# Patient Record
Sex: Female | Born: 1960 | Race: Black or African American | Hispanic: No | Marital: Married | Smoking: Current every day smoker
Health system: Southern US, Community
[De-identification: ages and names within clinical notes are randomized; demographics above are authoritative.]

## PROBLEM LIST (undated history)

## (undated) DIAGNOSIS — I10 Essential (primary) hypertension: Secondary | ICD-10-CM

## (undated) DIAGNOSIS — Z973 Presence of spectacles and contact lenses: Secondary | ICD-10-CM

## (undated) DIAGNOSIS — K219 Gastro-esophageal reflux disease without esophagitis: Secondary | ICD-10-CM

## (undated) HISTORY — PX: TUBAL LIGATION: SHX77

## (undated) HISTORY — DX: Presence of spectacles and contact lenses: Z97.3

## (undated) HISTORY — PX: COLONOSCOPY: SHX174

## (undated) HISTORY — DX: Gastro-esophageal reflux disease without esophagitis: K21.9

## (undated) HISTORY — DX: Essential (primary) hypertension: I10

## (undated) HISTORY — PX: ESOPHAGOGASTRODUODENOSCOPY: SHX1529

---

## 2001-03-01 ENCOUNTER — Emergency Department (HOSPITAL_COMMUNITY): Admission: EM | Admit: 2001-03-01 | Discharge: 2001-03-01 | Payer: Self-pay | Admitting: Emergency Medicine

## 2001-06-09 ENCOUNTER — Emergency Department (HOSPITAL_COMMUNITY): Admission: EM | Admit: 2001-06-09 | Discharge: 2001-06-09 | Payer: Self-pay | Admitting: Emergency Medicine

## 2001-06-09 ENCOUNTER — Encounter: Payer: Self-pay | Admitting: Emergency Medicine

## 2002-08-06 ENCOUNTER — Encounter (INDEPENDENT_AMBULATORY_CARE_PROVIDER_SITE_OTHER): Payer: Self-pay | Admitting: *Deleted

## 2002-08-14 ENCOUNTER — Encounter: Admission: RE | Admit: 2002-08-14 | Discharge: 2002-08-14 | Payer: Self-pay | Admitting: Family Medicine

## 2002-08-22 ENCOUNTER — Encounter: Payer: Self-pay | Admitting: Family Medicine

## 2002-08-22 ENCOUNTER — Encounter: Admission: RE | Admit: 2002-08-22 | Discharge: 2002-08-22 | Payer: Self-pay | Admitting: Family Medicine

## 2003-07-21 ENCOUNTER — Ambulatory Visit (HOSPITAL_COMMUNITY): Admission: RE | Admit: 2003-07-21 | Discharge: 2003-07-21 | Payer: Self-pay | Admitting: Family Medicine

## 2003-07-21 ENCOUNTER — Encounter: Admission: RE | Admit: 2003-07-21 | Discharge: 2003-07-21 | Payer: Self-pay | Admitting: Family Medicine

## 2005-05-26 ENCOUNTER — Emergency Department (HOSPITAL_COMMUNITY): Admission: EM | Admit: 2005-05-26 | Discharge: 2005-05-27 | Payer: Self-pay | Admitting: *Deleted

## 2006-08-31 ENCOUNTER — Encounter (INDEPENDENT_AMBULATORY_CARE_PROVIDER_SITE_OTHER): Payer: Self-pay | Admitting: *Deleted

## 2007-02-03 ENCOUNTER — Emergency Department (HOSPITAL_COMMUNITY): Admission: EM | Admit: 2007-02-03 | Discharge: 2007-02-04 | Payer: Self-pay | Admitting: Emergency Medicine

## 2008-02-19 ENCOUNTER — Emergency Department (HOSPITAL_COMMUNITY): Admission: EM | Admit: 2008-02-19 | Discharge: 2008-02-20 | Payer: Self-pay | Admitting: Emergency Medicine

## 2008-02-21 ENCOUNTER — Emergency Department (HOSPITAL_COMMUNITY): Admission: EM | Admit: 2008-02-21 | Discharge: 2008-02-21 | Payer: Self-pay | Admitting: Emergency Medicine

## 2008-02-24 ENCOUNTER — Ambulatory Visit: Payer: Self-pay | Admitting: Family Medicine

## 2009-05-02 ENCOUNTER — Emergency Department (HOSPITAL_COMMUNITY): Admission: EM | Admit: 2009-05-02 | Discharge: 2009-05-02 | Payer: Self-pay | Admitting: Emergency Medicine

## 2010-10-06 LAB — URINALYSIS, ROUTINE W REFLEX MICROSCOPIC
Bilirubin Urine: NEGATIVE
Glucose, UA: NEGATIVE mg/dL
Hgb urine dipstick: NEGATIVE
Ketones, ur: NEGATIVE mg/dL
Protein, ur: NEGATIVE mg/dL
pH: 5.5 (ref 5.0–8.0)

## 2010-10-06 LAB — COMPREHENSIVE METABOLIC PANEL
Alkaline Phosphatase: 60 U/L (ref 39–117)
BUN: 9 mg/dL (ref 6–23)
Creatinine, Ser: 0.87 mg/dL (ref 0.4–1.2)
Glucose, Bld: 107 mg/dL — ABNORMAL HIGH (ref 70–99)
Potassium: 3.3 mEq/L — ABNORMAL LOW (ref 3.5–5.1)
Total Bilirubin: 0.5 mg/dL (ref 0.3–1.2)
Total Protein: 7.5 g/dL (ref 6.0–8.3)

## 2010-10-06 LAB — DIFFERENTIAL
Basophils Absolute: 0.2 10*3/uL — ABNORMAL HIGH (ref 0.0–0.1)
Basophils Relative: 1 % (ref 0–1)
Lymphocytes Relative: 35 % (ref 12–46)
Monocytes Relative: 5 % (ref 3–12)
Neutro Abs: 7.5 10*3/uL (ref 1.7–7.7)
Neutrophils Relative %: 58 % (ref 43–77)

## 2010-10-06 LAB — POCT CARDIAC MARKERS
CKMB, poc: 3.4 ng/mL (ref 1.0–8.0)
Myoglobin, poc: 167 ng/mL (ref 12–200)

## 2010-10-06 LAB — CBC
HCT: 41.9 % (ref 36.0–46.0)
Hemoglobin: 13.9 g/dL (ref 12.0–15.0)
MCHC: 33.1 g/dL (ref 30.0–36.0)
MCV: 83.6 fL (ref 78.0–100.0)
RDW: 15.5 % (ref 11.5–15.5)

## 2011-01-12 IMAGING — CT CT HEAD W/O CM
1 series · 16 of 30 positions shown, 20 images · non-contrast
Comparison: 02/03/2007

CLINICAL DATA: Right-sided headache.

CT HEAD WITHOUT CONTRAST
TECHNIQUE: Contiguous axial images were obtained from the base of
the skull through the vertex without contrast.

[Series 2: headseq 4.8 h45s · axial · 0.43mm/px · z∈[-155,-26]mm · 16 of 30 slices shown, 20 images]
[im 2/30  brain]
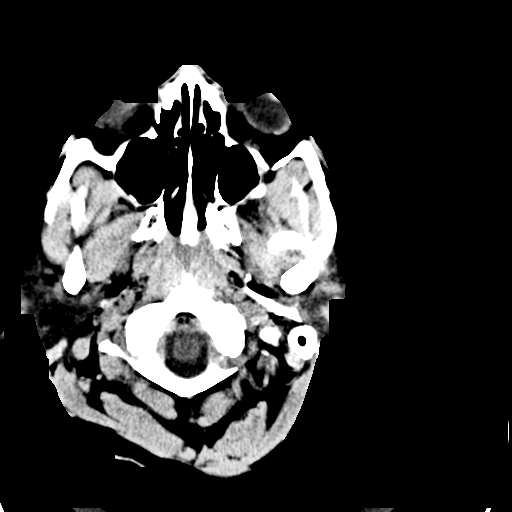
[im 2/30  bone]
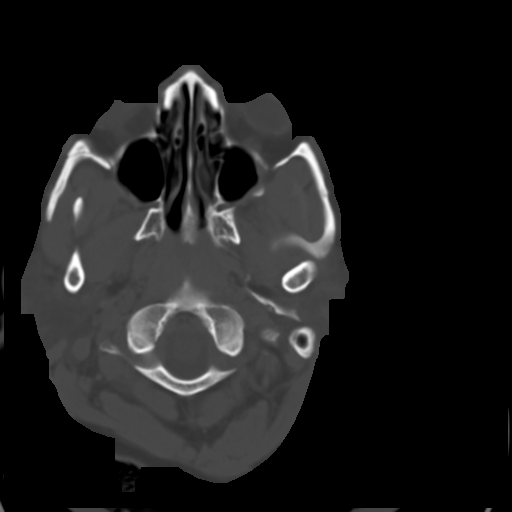
[im 4/30  brain]
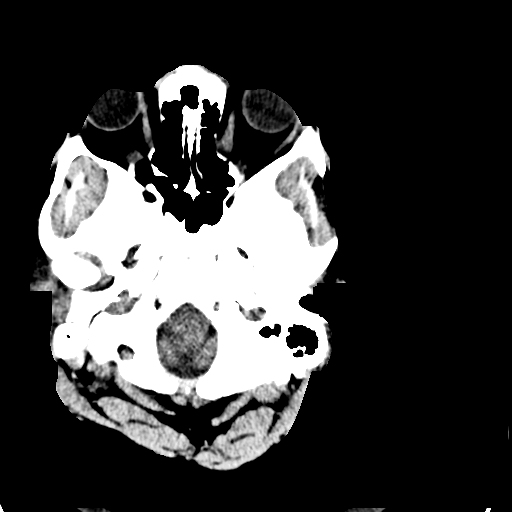
[im 6/30  brain]
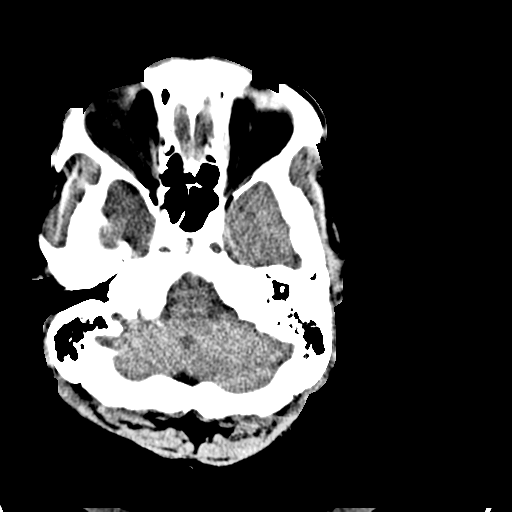
[im 8/30  brain]
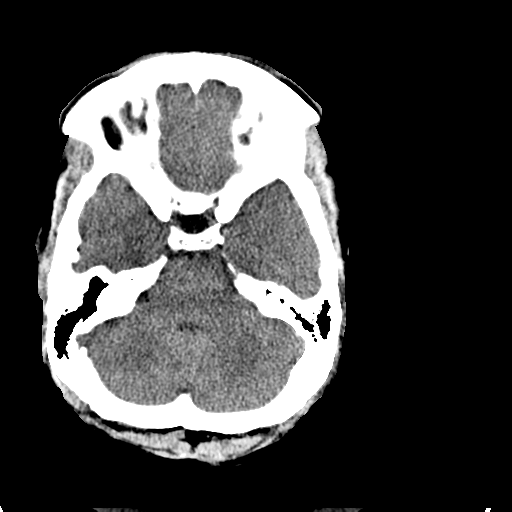
[im 9/30  brain]
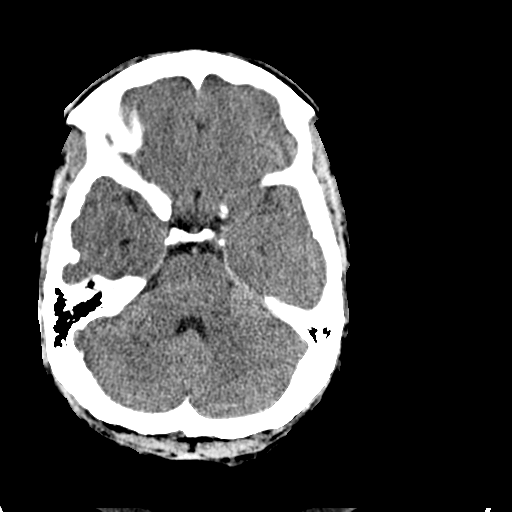
[im 9/30  bone]
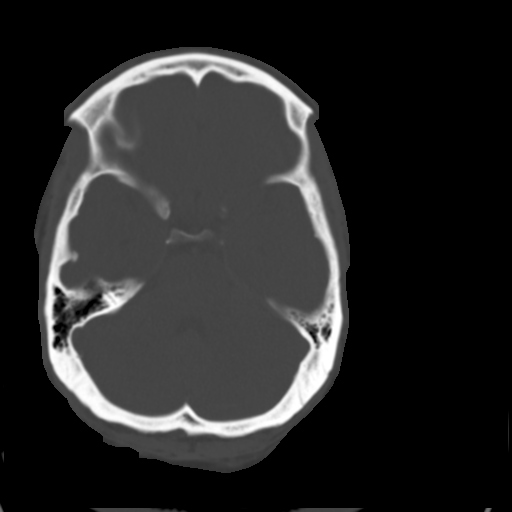
[im 11/30  brain]
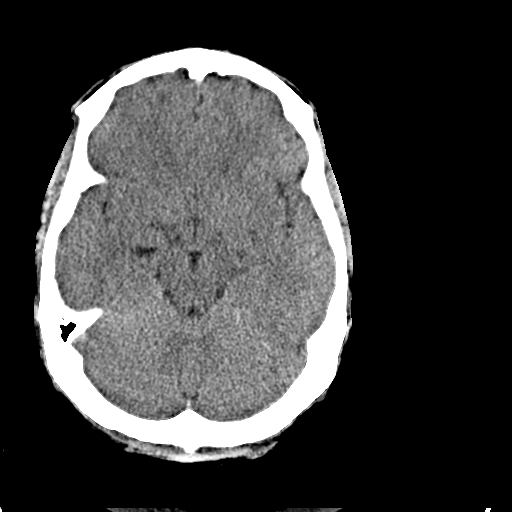
[im 13/30  brain]
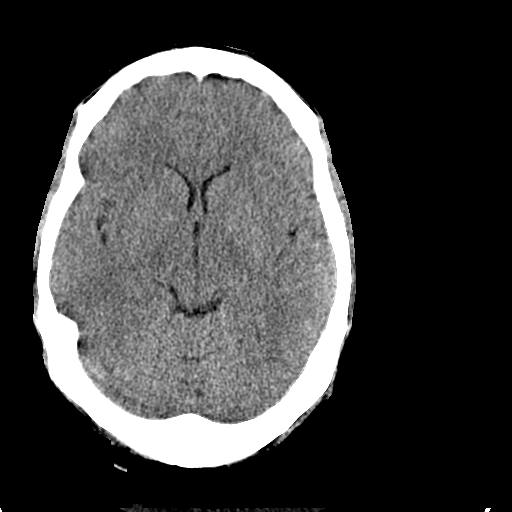
[im 15/30  brain]
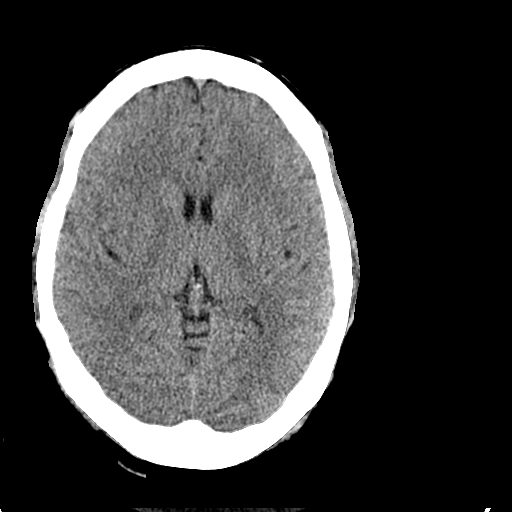
[im 16/30  brain]
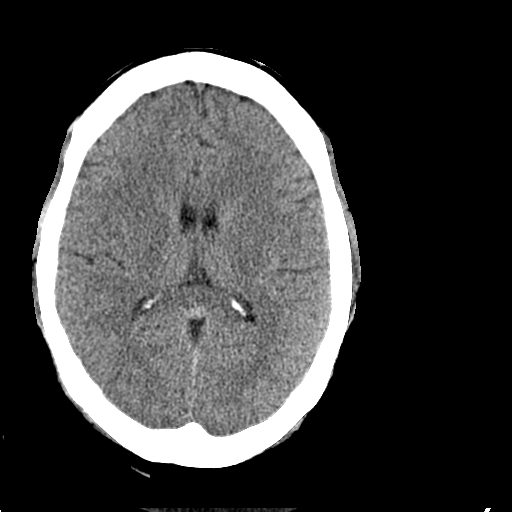
[im 16/30  bone]
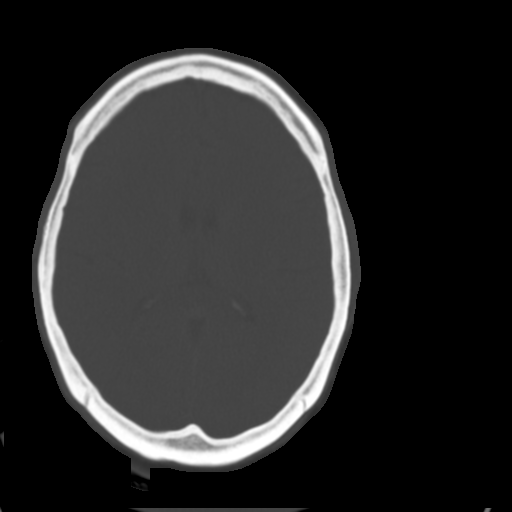
[im 18/30  brain]
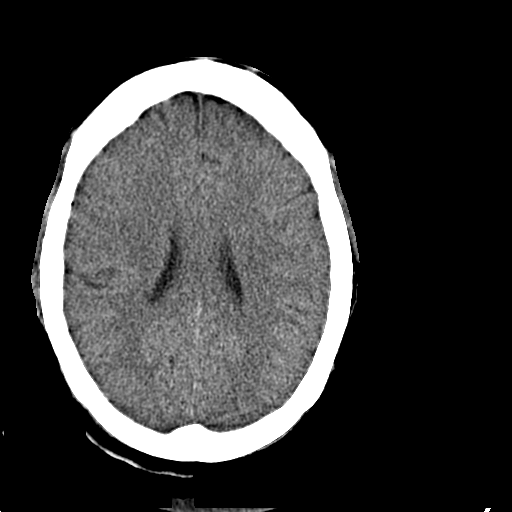
[im 20/30  brain]
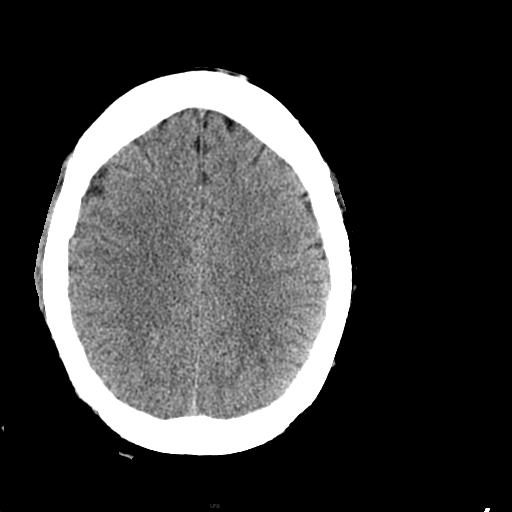
[im 22/30  brain]
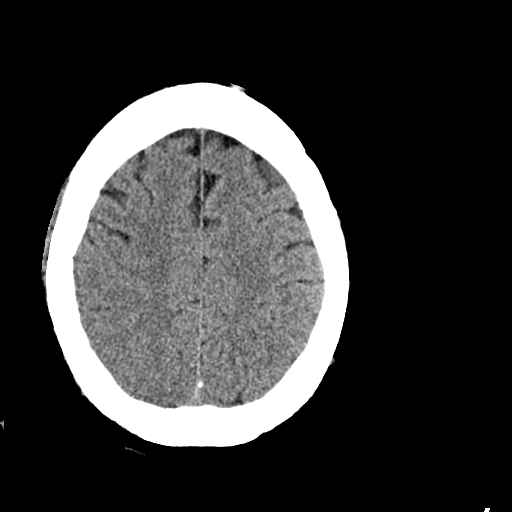
[im 23/30  brain]
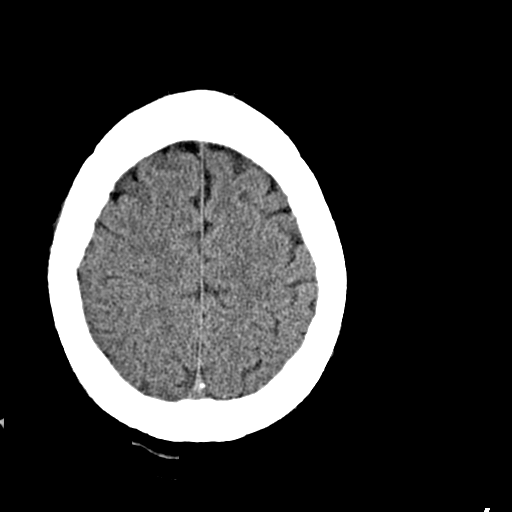
[im 23/30  bone]
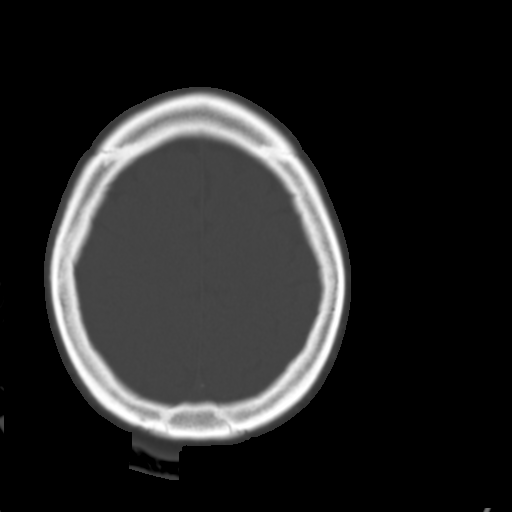
[im 25/30  brain]
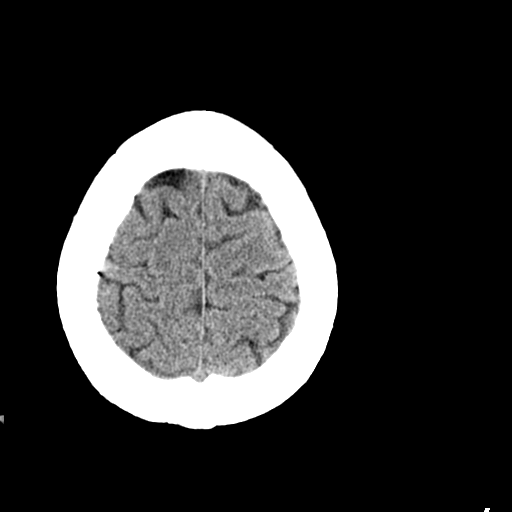
[im 27/30  brain]
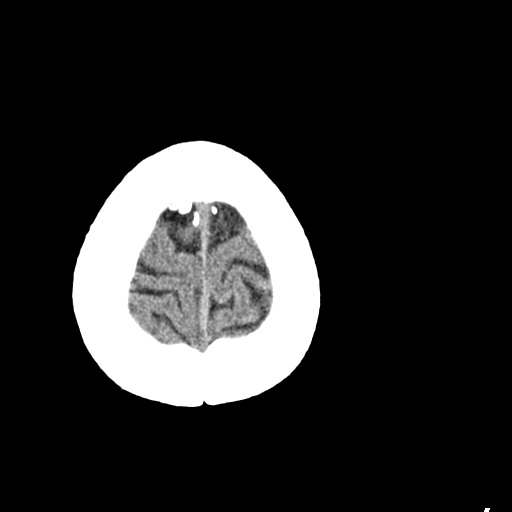
[im 29/30  brain]
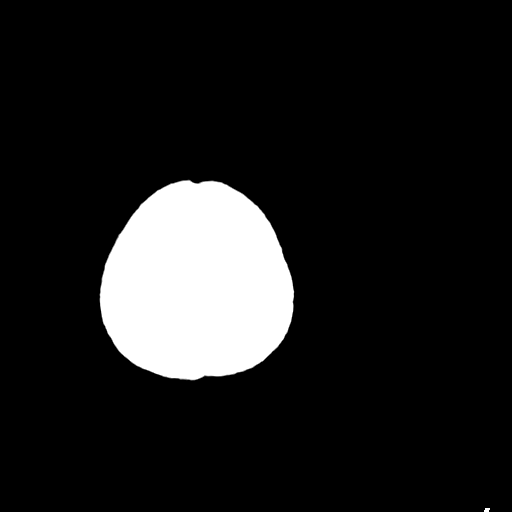

[16 of 30 positions shown; findings below may reference images not displayed]

FINDINGS: There is no acute infarction, intracranial hemorrhage, or
intracranial mass lesion.  Brain parenchyma appears normal.

There is no acute bony abnormality.  There is an old fracture of
the medial wall of the left orbit.
IMPRESSION: No acute intracranial abnormality.

## 2011-01-29 ENCOUNTER — Inpatient Hospital Stay (INDEPENDENT_AMBULATORY_CARE_PROVIDER_SITE_OTHER)
Admission: RE | Admit: 2011-01-29 | Discharge: 2011-01-29 | Disposition: A | Discharge status: Home / Self Care | Payer: Self-pay | Source: Ambulatory Visit | Attending: Family Medicine | Admitting: Family Medicine

## 2011-01-29 DIAGNOSIS — K219 Gastro-esophageal reflux disease without esophagitis: Secondary | ICD-10-CM

## 2011-10-22 ENCOUNTER — Encounter (HOSPITAL_COMMUNITY): Payer: Self-pay | Admitting: Emergency Medicine

## 2011-10-22 ENCOUNTER — Emergency Department (INDEPENDENT_AMBULATORY_CARE_PROVIDER_SITE_OTHER)
Admission: EM | Admit: 2011-10-22 | Discharge: 2011-10-22 | Disposition: A | Discharge status: Home / Self Care | Payer: BC Managed Care – PPO | Source: Home / Self Care | Attending: Family Medicine | Admitting: Family Medicine

## 2011-10-22 DIAGNOSIS — G5601 Carpal tunnel syndrome, right upper limb: Secondary | ICD-10-CM

## 2011-10-22 DIAGNOSIS — G5600 Carpal tunnel syndrome, unspecified upper limb: Secondary | ICD-10-CM

## 2011-10-22 MED ORDER — IBUPROFEN 800 MG PO TABS: 800.0000 mg | ORAL_TABLET | Freq: Three times a day (TID) | ORAL | Status: AC

## 2011-10-22 NOTE — ED Notes (Signed)
Right arm/hand pain and numbness onset 3 weeks ago.  Initially felt like hand going to sleep, now more frequent and pain is more significant.  Patient is right handed.  No known injury.

## 2011-10-22 NOTE — ED Provider Notes (Signed)
History     CSN: [id number]  Arrival date & time 10/22/11  0946   First MD Initiated Contact with Patient 10/22/11 [phone]      Chief Complaint  Patient presents with  . Arm Pain    (Consider location/radiation/quality/duration/timing/severity/associated sxs/prior treatment) Patient is a 51 y.o. female presenting with wrist pain. The history is provided by the patient.  Wrist Pain This is a new problem. The current episode started more than 1 week ago (3 weeks of pain which today has spread to forearm). The problem occurs constantly. The problem has been gradually worsening. Pertinent negatives include no chest pain, no abdominal pain, no headaches and no shortness of breath. Associated symptoms comments: Tingling worse at night and in am, no weakness, no other neuro sx to arm, neck or left side sx.Marland Kitchen    History reviewed. No pertinent past medical history.  History reviewed. No pertinent past surgical history.  No family history on file.  History  Substance Use Topics  . Smoking status: Current Everyday Smoker  . Smokeless tobacco: Not on file  . Alcohol Use: Yes    OB History    Grav Para Term Preterm Abortions TAB SAB Ect Mult Living                  Review of Systems  Constitutional: Negative.   HENT: Negative.   Respiratory: Negative for shortness of breath.   Cardiovascular: Negative for chest pain.  Gastrointestinal: Negative for abdominal pain.  Musculoskeletal: Negative for back pain and joint swelling.  Neurological: Positive for numbness. Negative for dizziness, syncope, speech difficulty, weakness and headaches.    Allergies  Review of patient's allergies indicates no known allergies.  Home Medications   Current Outpatient Rx  Name Route Sig Dispense Refill  . ACETAMINOPHEN 325 MG PO TABS Oral Take 650 mg by mouth every 6 (six) hours as needed.    Marland Kitchen NAPROXEN SODIUM 220 MG PO TABS Oral Take 220 mg by mouth 2 (two) times daily with a meal.    . IBUPROFEN  800 MG PO TABS Oral Take 1 tablet (800 mg total) by mouth 3 (three) times daily. 30 tablet 1    BP 142/83  Pulse 83  Temp(Src) 98.6 F (37 C) (Oral)  Resp 17  SpO2 100%  Physical Exam  Nursing note and vitals reviewed. Constitutional: She is oriented to person, place, and time. She appears well-developed and well-nourished.  HENT:  Head: Normocephalic.  Right Ear: External ear normal.  Left Ear: External ear normal.  Mouth/Throat: Oropharynx is clear and moist.  Eyes: Conjunctivae are normal. Pupils are equal, round, and reactive to light.  Neck: Normal range of motion. Neck supple. No thyromegaly present.  Cardiovascular: Normal rate, normal heart sounds and intact distal pulses.   Pulmonary/Chest: Breath sounds normal.  Musculoskeletal: Normal range of motion.       Arms: Lymphadenopathy:    She has no cervical adenopathy.  Neurological: She is alert and oriented to person, place, and time. She has normal reflexes.  Skin: Skin is warm and dry.  Psychiatric: She has a normal mood and affect.    ED Course  Procedures (including critical care time)  Labs Reviewed - No data to display No results found.   1. Carpal tunnel syndrome of right wrist       MDM         Linna Hoff, MD 10/22/11 1159

## 2011-10-22 NOTE — Discharge Instructions (Signed)
Wear splint for comfort but esp wear at night, see orthopedist if further problems.

## 2012-05-21 ENCOUNTER — Other Ambulatory Visit: Payer: Self-pay | Admitting: Medical

## 2012-05-21 ENCOUNTER — Encounter: Payer: Self-pay | Admitting: Medical

## 2012-05-21 ENCOUNTER — Ambulatory Visit (INDEPENDENT_AMBULATORY_CARE_PROVIDER_SITE_OTHER): Payer: BC Managed Care – PPO | Admitting: Medical

## 2012-05-21 VITALS — BP 140/100 | HR 115 | Temp 99.3°F | Resp 16 | Wt 172.0 lb

## 2012-05-21 DIAGNOSIS — K219 Gastro-esophageal reflux disease without esophagitis: Secondary | ICD-10-CM

## 2012-05-21 DIAGNOSIS — R509 Fever, unspecified: Secondary | ICD-10-CM

## 2012-05-21 DIAGNOSIS — R112 Nausea with vomiting, unspecified: Secondary | ICD-10-CM

## 2012-05-21 DIAGNOSIS — E860 Dehydration: Secondary | ICD-10-CM

## 2012-05-21 DIAGNOSIS — R10819 Abdominal tenderness, unspecified site: Secondary | ICD-10-CM

## 2012-05-21 LAB — CBC WITH DIFFERENTIAL/PLATELET
Basophils Absolute: 0 10*3/uL (ref 0.0–0.1)
Lymphocytes Relative: 29 % (ref 12–46)
Neutro Abs: 9.8 10*3/uL — ABNORMAL HIGH (ref 1.7–7.7)
Neutrophils Relative %: 63 % (ref 43–77)
Platelets: 397 10*3/uL (ref 150–400)
RDW: 14.4 % (ref 11.5–15.5)
WBC: 15.6 10*3/uL — ABNORMAL HIGH (ref 4.0–10.5)

## 2012-05-21 LAB — HEPATIC FUNCTION PANEL
AST: 14 U/L (ref 0–37)
Albumin: 4.9 g/dL (ref 3.5–5.2)
Total Bilirubin: 0.7 mg/dL (ref 0.3–1.2)

## 2012-05-21 LAB — LIPASE: Lipase: 16 U/L (ref 0–75)

## 2012-05-21 LAB — BASIC METABOLIC PANEL
BUN: 31 mg/dL — ABNORMAL HIGH (ref 6–23)
Calcium: 10.6 mg/dL — ABNORMAL HIGH (ref 8.4–10.5)
Glucose, Bld: 115 mg/dL — ABNORMAL HIGH (ref 70–99)

## 2012-05-21 MED ORDER — PROMETHAZINE HCL 25 MG/ML IJ SOLN
25.0000 mg | Freq: Once | INTRAMUSCULAR | Status: AC
  Administered 2012-05-21: 25 mg via INTRAMUSCULAR

## 2012-05-21 MED ORDER — PROMETHAZINE HCL 25 MG RE SUPP: 25.0000 mg | Freq: Four times a day (QID) | RECTAL | Status: DC | PRN

## 2012-05-21 MED ORDER — OMEPRAZOLE 20 MG PO TBEC: 1.0000 | DELAYED_RELEASE_TABLET | Freq: Two times a day (BID) | ORAL | Status: DC

## 2012-05-21 NOTE — Progress Notes (Signed)
Subjective: Here as a new patient today.  Was seeing urgent care prior for acute issues.  She reports 4 days ago awoke with nausea, vomiting, some projectile.  Since Friday been having multiple episodes of NV daily.   15 episodes of vomiting at least Sunday, 10 x yesterday, so far 4 times today.  She notes generalized weakness, dizziness, fatigue, fever up to 101.   Denies loose stool or diarrhea, no constipation, last BM was normal few days ago, but hasn't eaten anything the last few days to have a BM.  She works as a Financial risk analyst.  No sick contacts in home or at work.  Denies vomiting blood.  No urinary symptoms, no reports of undercooked meat or potentially contaminated food. No recent travel, no camping, no consumption of stream/lake water.  No new animal exposure.   She notes similar but milder episode last August related to bad reflux.  She thinks this is coming from reflux now.  She notes hx/o bad GERD, has tried various OTC medication, was on the purple pill once, but no prior EGD or colonoscopy.  She has had a lot of belching recently.  She also recently cut back on tobacco.  Of note she reports insurance physical with labs 4 mo ago that was "normal."  However, she was 202 lb and now has lost over 20 lb unintentionally.  Past Medical History  Diagnosis Date  . Hypertension   . Wears glasses   . GERD (gastroesophageal reflux disease)     Review of Systems Constitutional: +fever, +chills, +sweats, +unexpected weight change,+fatigue ENT: -runny nose, -ear pain, -sore throat Cardiology:  -chest pain, -palpitations, -edema Respiratory: -cough, -shortness of breath, -wheezing Gastroenterology: -abdominal pain, +nausea, +vomiting, -diarrhea, -constipation  Musculoskeletal: -arthralgias, +myalgias, -joint swelling, -back pain Ophthalmology: -vision changes Urology: -dysuria, -difficulty urinating, -hematuria, -urinary frequency, -urgency Neurology: -headache, +weakness, -tingling, -numbness        Objective:   Physical Exam  Filed Vitals:   05/21/12 1151  BP: 140/100  Pulse: 115  Temp:   Resp:     General appearance: alert, no distress, WD/WN, ill appearing Skin: no obvious decreased turgor HEENT: normocephalic, sclerae anicteric, TMs pearly, nares patent, no discharge or erythema, pharynx normal Oral cavity: dry mucous membranes, no lesions Neck: supple, no lymphadenopathy, no thyromegaly, no masses Heart: RRR, normal S1, S2, no murmurs Lungs: CTA bilaterally, no wheezes, rhonchi, or rales Abdomen: +bs, soft, generalized tenderness, no rebound, non distended, no masses, no hepatomegaly, no splenomegaly Pulses: 2+ symmetric, upper and lower extremities, normal cap refill   Assessment and Plan :    Encounter Diagnoses  Name Primary?  . Nausea & vomiting Yes  . Dehydration   . Fever   . GERD (gastroesophageal reflux disease)   . Abdominal tenderness    After reviewing symptoms, +orthostatic vitals, and reviewing differential, gave her options.  Also discussed eval and plan with Dr. Susann Givens, supervising physician.  Advised ED eval vs outpatient urgent eval.  She wants to avoid ED at this point.  We got stat labs today, gave IM Phenergan 25mg  in office.   Her sister came in to pick her up.  discussed the labs, medication given today, need to get significant oral rehydration over the next several hours once nausea is subsiding.   We will call back with stat labs and additional plan in a few hours.  The differential is wide - diverticulitis, pancreatitis, viral, food poisoning, etc, other.  Will ultimately need endoscopy and colonoscopy both for longstanding GERD  uncontrolled to rule out barrets and other, but due for baseline colonoscopy as well.  H pylori labs today along with comprehensive lab panel.

## 2012-05-21 NOTE — Patient Instructions (Signed)
Symptoms and exam today suggest Dehydration, nausea, vomiting, abdominal tenderness.    We are checking labs STAT.  We gave you Promethazine 25mg  shot today to help with nausea.  I also sent Promethazine rectal suppositories to the pharmacy that you can use for nausea/vomiting.  This should be taken every 6 hours as needed for nausea.    We will call in a few hours regarding labs and next steps.  This may include going for xray or potentially going to the emergency dept pending lab results.    In the meantime, as your nausea calms down, increase your liquid intake orally.   Try and drink Gatorade, 1 oz every 15 minutes.  Once you are tolerating this, increase the amount of Gatorade consumed.    Over the next few hours, continue a variety of oral hydration measures - water, gatorade, soup broth, gingerale, pop sickles, ice chips,etc.  The goal is to hydrate to the point that you urine is clear.    At any point if much worse, go to the emergency dept.

## 2012-05-21 NOTE — Addendum Note (Signed)
Addended by: Leretha Dykes L on: 05/21/2012 02:10 PM   Modules accepted: Orders

## 2012-05-23 ENCOUNTER — Other Ambulatory Visit: Payer: Self-pay | Admitting: Medical

## 2012-05-23 LAB — HELICOBACTER PYLORI ABS-IGG+IGA, BLD: H Pylori IgG: >8 {ISR} — ABNORMAL HIGH

## 2012-05-23 MED ORDER — AMOXICILLIN 500 MG PO CAPS: 500.0000 mg | ORAL_CAPSULE | Freq: Two times a day (BID) | ORAL | Status: DC

## 2012-05-23 MED ORDER — CLARITHROMYCIN 500 MG PO TABS: ORAL_TABLET | ORAL | Status: DC

## 2012-05-23 MED ORDER — OMEPRAZOLE 40 MG PO CPDR: DELAYED_RELEASE_CAPSULE | ORAL | Status: DC

## 2012-05-23 NOTE — Progress Notes (Signed)
Quick Note:  CALLED PT HOME AND CELL# PT INFORMED AND SHE CAN KEEP FOOD DOWN AND FLUIDS PT HAS APPT NOV 26 SHE VERBALIZED UNDERSTANDING IF SHE FEELS WORSE TOMORROW SHE WILL CALL AND COME AND SEE SHANE THEN ______

## 2012-05-24 ENCOUNTER — Telehealth: Payer: Self-pay | Admitting: Family Medicine

## 2012-05-24 NOTE — Telephone Encounter (Signed)
Patient states that she is still naused, started back vomiting and unable to get fluids in. CLS Patient would like to know what does she need to do next?

## 2012-05-24 NOTE — Telephone Encounter (Signed)
Patient was made aware that [name] advises her to go to the ER for further treatment. CLS

## 2012-05-24 NOTE — Telephone Encounter (Signed)
At this point, if not improving or unable to keep in fluids, its probably time to be seen in the ED for IV fluids, additional eval.  I have her on H pylori treatment, but she seems to be dehydrated and bad enough when I saw her 2 days ago.  Thus, at this point, advise ED if not much improvement or unable to keep fluids in.

## 2012-05-28 ENCOUNTER — Telehealth: Payer: Self-pay | Admitting: Family Medicine

## 2012-05-28 ENCOUNTER — Ambulatory Visit (INDEPENDENT_AMBULATORY_CARE_PROVIDER_SITE_OTHER): Payer: BC Managed Care – PPO | Admitting: Medical

## 2012-05-28 ENCOUNTER — Encounter: Payer: Self-pay | Admitting: Medical

## 2012-05-28 VITALS — BP 110/70 | HR 105 | Temp 98.0°F | Resp 14 | Ht 67.0 in | Wt 180.0 lb

## 2012-05-28 DIAGNOSIS — K219 Gastro-esophageal reflux disease without esophagitis: Secondary | ICD-10-CM

## 2012-05-28 DIAGNOSIS — A048 Other specified bacterial intestinal infections: Secondary | ICD-10-CM

## 2012-05-28 DIAGNOSIS — R1013 Epigastric pain: Secondary | ICD-10-CM

## 2012-05-28 DIAGNOSIS — Z1211 Encounter for screening for malignant neoplasm of colon: Secondary | ICD-10-CM

## 2012-05-28 DIAGNOSIS — R000 Tachycardia, unspecified: Secondary | ICD-10-CM

## 2012-05-28 DIAGNOSIS — R112 Nausea with vomiting, unspecified: Secondary | ICD-10-CM

## 2012-05-28 DIAGNOSIS — F17200 Nicotine dependence, unspecified, uncomplicated: Secondary | ICD-10-CM

## 2012-05-28 MED ORDER — DEXLANSOPRAZOLE 60 MG PO CPDR: 60.0000 mg | DELAYED_RELEASE_CAPSULE | Freq: Every day | ORAL | Status: DC

## 2012-05-28 MED ORDER — ATENOLOL 25 MG PO TABS: 25.0000 mg | ORAL_TABLET | Freq: Every day | ORAL | Status: DC

## 2012-05-28 NOTE — Patient Instructions (Signed)
Finish the antibiotic regimen (amoxicillin and Biaxin). This is a 14 day course.    Begin Dexilant once daily in the morning 45 minutes before breakfast.  This is to replace Omeprazole/Prilosec.  Use all the samples, and I sent this to pharmacy.  I want you to continue this daily for at least a month or 2.   I sent prescription to pharmacy and you have a coupon card in the bag.  Avoid foods that aggravate reflux such as acidic foods, citrus, tomato based foods, greasy and fried foods, peppers, etc.   We will refer you to Dr. Elnoria Howard for further evaluation and colonoscopy screening.   Given your history of high blood pressure and fast heart rate, I am starting you on a once daily medication called Atenolol.  I want to see you back in a month or so for recheck and a physical.

## 2012-05-28 NOTE — Progress Notes (Signed)
Subjective: Here for f/u.  Last week I saw her as a new patient for acute illness.  Since then she feels much better.  She hasn't had to use phenergan suppository since 11/19, she has had no more nausea or vomiting, and now she has only mild discomfort in the epigastric region.  She notes hx/o GERD for at least 6 years.  She is a smoker, but hasn't smoked but 5 cigarettes since last visit.  She does have some belching.  Overall feels better, is tolerating the medication regimen for H pylori, and ready to go back to work.  No new c/o.  Past Medical History  Diagnosis Date  . Hypertension   . Wears glasses   . GERD (gastroesophageal reflux disease)    ROS as in HPI  Gen: wd, wn, nad Oral: MMM, no lesions Lungs: CTA Heart: RRR, normal S1, S2, no murmurs GI: +bs, soft, mild epigastric tenderness, otherwise nontender, no mass, no organomegaly Pulses: normal   Adult ECG Report  Indication: tachycardia  Rate: 98bpm  Rhythm: normal sinus rhythm  QRS Axis: 54 degrees  PR Interval:  QRS Duration: 86ms  QTc:  Conduction Disturbances: computer read EKG as prolonged QT, but old EKG is similar without the reading, and our EKG monitor has been giving this as possible erroneous reading.  thus, questionable long QT  Other Abnormalities: probable left atrial enlargement  Patient's cardiac risk factors are: dyslipidemia, hypertension, sedentary lifestyle and smoking/ tobacco exposure.  EKG comparison: 04/2009 with no significant changes  Narrative Interpretation: probable left atrial enlargement, questionable long QT   Assessment: Encounter Diagnoses  Name Primary?  . Epigastric pain Yes  . Nausea & vomiting   . H. pylori infection   . GERD (gastroesophageal reflux disease)   . Tachycardia   . Screen for colon cancer   . Tobacco use disorder    Plan: Epigastric pain, N/V - much improved  H pylori - finish amoxicillin, Biaxin, and need to use the PPI as advised.   dexilant  samples given.  Script written.  GERD - avoid trigger foods, begin samples of Dexilant. Advised she discuss endoscopy with Dr. Elnoria Howard.  Tachycardia - reviewed EKG and compared to 2010 EKG.  Begin Atenolol 25mg  daily.  F/u in 1-2 mo (after GI consult).  Will need echocardiogram going forward.  Screen for colon cancer - she will check insurance deductible year and let me know so we can refer to Dr. Bernarda Caffey  Tobacco use - advised she completely stop tobacco

## 2012-05-29 ENCOUNTER — Telehealth: Payer: Self-pay | Admitting: Medical

## 2012-05-29 ENCOUNTER — Telehealth: Payer: Self-pay | Admitting: Family Medicine

## 2012-05-29 NOTE — Telephone Encounter (Signed)
I am waiting in this patient to call us back to see what her insurance will cover. CLS

## 2012-05-29 NOTE — Telephone Encounter (Signed)
Message copied by Janeice Robinson on Wed May 29, 2012 10:11 AM ------      Message from: Jac Canavan      Created: Tue May 28, 2012 10:10 AM       Pt will check insurance about deductible, but I need to refer her to Dr. Elnoria Howard either in late December or in January for screening colonoscopy and EGD for hx/o GERD, recent H pylori treatment

## 2012-05-29 NOTE — Telephone Encounter (Signed)
PT STATES CAN'T AFFORD TO GO TO GI UNTIL January BECAUSE OF COST

## 2012-05-29 NOTE — Telephone Encounter (Signed)
Done

## 2012-06-10 ENCOUNTER — Telehealth: Payer: Self-pay | Admitting: Medical

## 2012-06-12 ENCOUNTER — Telehealth: Payer: Self-pay | Admitting: Medical

## 2012-06-12 NOTE — Telephone Encounter (Signed)
LM

## 2012-06-12 NOTE — Telephone Encounter (Signed)
lm

## 2012-08-02 ENCOUNTER — Encounter: Payer: BC Managed Care – PPO | Admitting: Medical

## 2012-08-17 ENCOUNTER — Other Ambulatory Visit: Payer: Self-pay

## 2012-09-24 ENCOUNTER — Ambulatory Visit (INDEPENDENT_AMBULATORY_CARE_PROVIDER_SITE_OTHER): Payer: BC Managed Care – PPO | Admitting: Medical

## 2012-09-24 ENCOUNTER — Encounter: Payer: Self-pay | Admitting: Medical

## 2012-09-24 VITALS — BP 130/80 | HR 80 | Temp 97.9°F | Resp 16 | Wt 195.0 lb

## 2012-09-24 DIAGNOSIS — R0602 Shortness of breath: Secondary | ICD-10-CM

## 2012-09-24 DIAGNOSIS — F430 Acute stress reaction: Secondary | ICD-10-CM

## 2012-09-24 DIAGNOSIS — R079 Chest pain, unspecified: Secondary | ICD-10-CM

## 2012-09-24 DIAGNOSIS — R002 Palpitations: Secondary | ICD-10-CM

## 2012-09-24 LAB — CBC WITH DIFFERENTIAL/PLATELET
Basophils Relative: 0 % (ref 0–1)
Eosinophils Absolute: 0.1 10*3/uL (ref 0.0–0.7)
Eosinophils Relative: 1 % (ref 0–5)
Hemoglobin: 11.8 g/dL — ABNORMAL LOW (ref 12.0–15.0)
MCH: 26.9 pg (ref 26.0–34.0)
MCHC: 32.9 g/dL (ref 30.0–36.0)
MCV: 81.8 fL (ref 78.0–100.0)
Monocytes Absolute: 0.8 10*3/uL (ref 0.1–1.0)
Monocytes Relative: 8 % (ref 3–12)
Neutrophils Relative %: 48 % (ref 43–77)

## 2012-09-24 MED ORDER — LORAZEPAM 0.5 MG PO TABS: 0.5000 mg | ORAL_TABLET | Freq: Two times a day (BID) | ORAL | Status: DC | PRN

## 2012-09-24 NOTE — Progress Notes (Signed)
Subjective: Here for c/o chest pain and SOB.   She is not sure if this is anxiety or something else.  She has been dealing with a lot of stress.  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   Husband out of work, hard to find a job currently.   She notes that her sister, age 52yo is on life support currently s/p stroke . Sister has had prior MI and stroke.    She notes hx/o intermittent chest pains, SOB, heart fluttering in the past several months, but more frequent the last 2 weeks.  She notes no prior cardiology eval.  She notes SOB periodic during the day. Sometime at rest, sometimes with walking.   SOB is brief.  CP throughout the day, like pin stuck her, lasts a few seconds.  Sometimes gets sweats with the chest pains. No dizziness, but legs can feel weak.  Vision has been blurry at times.  Like black spots in front of her eyes.  No loss of vision.   No other aggravating or relieving factors.  Does feel like she needs something to calm her nerves.   Objective: Filed Vitals:   09/24/12 1543  BP: 130/80  Pulse: 80  Temp: 97.9 F (36.6 C)  Resp: 16    General appearance: alert, no distress, WD/WN Oral cavity: MMM, no lesions Neck: supple, no lymphadenopathy, no thyromegaly, no masses Heart: RRR, normal S1, S2, no murmurs Lungs: CTA bilaterally, no wheezes, rhonchi, or rales Abdomen: +bs, soft, non tender, non distended, no masses, no hepatomegaly, no splenomegaly Pulses: 2+ symmetric, upper and lower extremities, normal cap refill Neuro: CN2-12 intact, nonfocal exam   Adult ECG Report  Indication: chest pain  Rate: 81 bpm  Rhythm: normal sinus rhythm  QRS Axis: 52 degrees  PR Interval: 154 ms  QRS Duration: 84 ms  QTc:  Conduction Disturbances: none  Other Abnormalities: P wave c/w left atrial enlargement  Patient's cardiac risk factors are: family history of premature cardiovascular  disease, hypertension and obesity (BMI >= 30 kg/m2).  EKG comparison:05/2012, no change  Narrative Interpretation: possible left atrial enlargement, no change from 05/2012 EKG, no acute changes.  05/2012 EKG on different machine suggested long QT compared to today's more normal intervals on EKG   Assessment: Encounter Diagnoses  Name Primary?  . Chest pain Yes  . SOB (shortness of breath)   . Palpitations   . Acute stress reaction     Plan: Chest pain, SOB, palpitations - symptoms somewhat atypical, could be related to anxiety, but can't entirely rule out cardiac source.  Short term Ativan given for anxiety, panic.   Discussed risks/benefits of medication.  Given her risk factors, symptoms, refer to cardiology.   Labs today given some minor abnormalities on last labs done a few months ago.  If worsening chest pain, call, return or call 911.  Follow-up pending labs, referral

## 2012-09-25 LAB — COMPREHENSIVE METABOLIC PANEL
AST: 19 U/L (ref 0–37)
Albumin: 4.1 g/dL (ref 3.5–5.2)
Alkaline Phosphatase: 47 U/L (ref 39–117)
BUN: 19 mg/dL (ref 6–23)
Potassium: 4.1 mEq/L (ref 3.5–5.3)
Sodium: 142 mEq/L (ref 135–145)

## 2012-09-25 LAB — HIGH SENSITIVITY CRP: CRP, High Sensitivity: 14.5 mg/L — ABNORMAL HIGH

## 2013-01-08 ENCOUNTER — Telehealth: Payer: Self-pay | Admitting: Internal Medicine

## 2013-01-08 DIAGNOSIS — R000 Tachycardia, unspecified: Secondary | ICD-10-CM

## 2013-01-08 MED ORDER — ATENOLOL 25 MG PO TABS: 25.0000 mg | ORAL_TABLET | Freq: Every day | ORAL | Status: DC

## 2013-01-08 NOTE — Telephone Encounter (Signed)
Medication refill was sent to the pharmacy. CLS 

## 2013-01-08 NOTE — Telephone Encounter (Signed)
Refill request for atenolol 25mg  to [facility] pharmacy [address]

## 2013-05-08 ENCOUNTER — Other Ambulatory Visit: Payer: Self-pay

## 2013-05-16 ENCOUNTER — Encounter: Payer: Self-pay | Admitting: Medical

## 2013-05-16 ENCOUNTER — Ambulatory Visit (INDEPENDENT_AMBULATORY_CARE_PROVIDER_SITE_OTHER): Payer: BC Managed Care – PPO | Admitting: Medical

## 2013-05-16 VITALS — BP 150/90 | HR 82 | Temp 98.3°F | Resp 18 | Wt 202.0 lb

## 2013-05-16 DIAGNOSIS — F17200 Nicotine dependence, unspecified, uncomplicated: Secondary | ICD-10-CM

## 2013-05-16 DIAGNOSIS — R05 Cough: Secondary | ICD-10-CM

## 2013-05-16 DIAGNOSIS — J069 Acute upper respiratory infection, unspecified: Secondary | ICD-10-CM

## 2013-05-16 MED ORDER — AZITHROMYCIN 250 MG PO TABS: ORAL_TABLET | ORAL | Status: DC

## 2013-05-16 MED ORDER — HYDROCODONE-HOMATROPINE 5-1.5 MG/5ML PO SYRP: 5.0000 mL | ORAL_SOLUTION | Freq: Three times a day (TID) | ORAL | Status: DC | PRN

## 2013-05-16 NOTE — Patient Instructions (Signed)
Cough/respiratory infection  Begin Azithromycin antibiotic daily for 5 days Begin Hycodan syrup at nighttime for cough as needed In the future use either Mucinex DM or Coricidin HBP for cough/congestion.  Avoid other decongestants such as sudafed, phenylephrine as they can raise your blood pressure.  Increase your water intake If worse or not improving in the next 3-4 days, then call back.

## 2013-05-16 NOTE — Progress Notes (Signed)
Subjective:  Destiny Long is a 52 y.o. female who presents for cough and nasal congestion.  Symptoms include 7 day hx/o cough, productive of yellow phlegm, sneezing, runny nose, feeling of ear fullness, awakening with cough, no change in symptoms, but cough moving deeper into chest.  Had fever up to 101 the first two days.  Denies NVD, no abdominal pain, no sinus pressure, no SOB or wheezing.  Treatment to date: cough suppressants and decongestants.  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   She does smoke.   No other aggravating or relieving factors.  No other c/o.  The following portions of the patient's history were reviewed and updated as appropriate: allergies, current medications, past family history, past medical history, past social history, past surgical history and problem list.  ROS as in subjective  Past Medical History  Diagnosis Date  . Hypertension   . Wears glasses   . GERD (gastroesophageal reflux disease)      Objective: BP 150/90  Pulse 82  Temp(Src) 98.3 F (36.8 C) (Oral)  Resp 18  Wt 202 lb (91.627 kg)  General appearance: Alert, WD/WN, no distress, mildly ill appearing, tobacco odor present                             Skin: warm, no rash, no diaphoresis                           Head: no sinus tenderness                            Eyes: conjunctiva normal, corneas clear, PERRLA                            Ears: flat TMs, serous effusion behind left TM, external ear canals normal                          Nose: septum midline, turbinates swollen, with erythema and clear discharge             Mouth/throat: MMM, tongue normal, mild pharyngeal erythema                           Neck: supple, no adenopathy, no thyromegaly, nontender                          Heart: RRR, normal S1, S2, no murmurs                         Lungs: +bronchial breath sounds, no rhonchi, no wheezes, no rales                Extremities: no edema, nontender     Assessment: Encounter  Diagnoses  Name Primary?  . Cough Yes  . Tobacco use disorder   . Acute upper respiratory infections of unspecified site      Plan:  Begin azithromycin, Hycodan for syrup.  Suggested symptomatic OTC remedies for cough and congestion.  Tylenol OTC for fever and malaise.  Call/return in 2-3 days if symptoms are worse or not improving.  Advised that cough may linger even after the infection is improved.

## 2013-09-25 ENCOUNTER — Telehealth: Payer: Self-pay | Admitting: Medical

## 2013-09-25 NOTE — Telephone Encounter (Signed)
Tried to reach pt and we have no valid #'s

## 2013-09-25 NOTE — Telephone Encounter (Signed)
Can come in to discuss

## 2013-11-20 ENCOUNTER — Other Ambulatory Visit: Payer: Self-pay | Admitting: Medical

## 2013-11-20 NOTE — Telephone Encounter (Signed)
Refill but lets set up for physical since none recent

## 2013-11-20 NOTE — Telephone Encounter (Signed)
Called Rx into pharmacy for 1 month needs AEX per Vincenza HewsShane, unable to reach patient by phone.

## 2013-11-20 NOTE — Telephone Encounter (Signed)
Ok to RF? Patient hasn't had AEX in 2 yrs, but has been here for acute issues.

## 2013-11-27 ENCOUNTER — Other Ambulatory Visit: Payer: Self-pay | Admitting: Medical

## 2013-11-27 NOTE — Telephone Encounter (Signed)
Needs Office Visit

## 2014-04-17 ENCOUNTER — Other Ambulatory Visit: Payer: BC Managed Care – PPO

## 2024-01-30 ENCOUNTER — Emergency Department (HOSPITAL_COMMUNITY)

## 2024-01-30 ENCOUNTER — Other Ambulatory Visit: Payer: Self-pay

## 2024-01-30 ENCOUNTER — Emergency Department (HOSPITAL_COMMUNITY)
Admission: EM | Admit: 2024-01-30 | Discharge: 2024-01-30 | Disposition: A | Discharge status: Home / Self Care | Attending: Emergency Medicine | Admitting: Emergency Medicine

## 2024-01-30 DIAGNOSIS — R600 Localized edema: Secondary | ICD-10-CM | POA: Diagnosis not present

## 2024-01-30 DIAGNOSIS — I10 Essential (primary) hypertension: Secondary | ICD-10-CM | POA: Insufficient documentation

## 2024-01-30 DIAGNOSIS — Z720 Tobacco use: Secondary | ICD-10-CM | POA: Diagnosis not present

## 2024-01-30 DIAGNOSIS — R2243 Localized swelling, mass and lump, lower limb, bilateral: Secondary | ICD-10-CM | POA: Diagnosis present

## 2024-01-30 DIAGNOSIS — M7989 Other specified soft tissue disorders: Secondary | ICD-10-CM

## 2024-01-30 LAB — BRAIN NATRIURETIC PEPTIDE: B Natriuretic Peptide: 13.6 pg/mL (ref 0.0–100.0)

## 2024-01-30 LAB — CBC WITH DIFFERENTIAL/PLATELET
Abs Immature Granulocytes: 0.02 K/uL (ref 0.00–0.07)
Basophils Absolute: 0 K/uL (ref 0.0–0.1)
Basophils Relative: 0 %
Eosinophils Absolute: 0.2 K/uL (ref 0.0–0.5)
Eosinophils Relative: 2 %
HCT: 38.2 % (ref 36.0–46.0)
Hemoglobin: 12.3 g/dL (ref 12.0–15.0)
Immature Granulocytes: 0 %
Lymphocytes Relative: 46 %
Lymphs Abs: 4.3 K/uL — ABNORMAL HIGH (ref 0.7–4.0)
MCH: 26.5 pg (ref 26.0–34.0)
MCHC: 32.2 g/dL (ref 30.0–36.0)
MCV: 82.3 fL (ref 80.0–100.0)
Monocytes Absolute: 0.8 K/uL (ref 0.1–1.0)
Monocytes Relative: 9 %
Neutro Abs: 4 K/uL (ref 1.7–7.7)
Neutrophils Relative %: 43 %
Platelets: 324 K/uL (ref 150–400)
RBC: 4.64 MIL/uL (ref 3.87–5.11)
RDW: 17.1 % — ABNORMAL HIGH (ref 11.5–15.5)
WBC: 9.3 K/uL (ref 4.0–10.5)
nRBC: 0 % (ref 0.0–0.2)

## 2024-01-30 LAB — BASIC METABOLIC PANEL WITH GFR
Anion gap: 9 (ref 5–15)
BUN: 21 mg/dL (ref 8–23)
CO2: 24 mmol/L (ref 22–32)
Calcium: 9.1 mg/dL (ref 8.9–10.3)
Chloride: 105 mmol/L (ref 98–111)
Creatinine, Ser: 0.89 mg/dL (ref 0.44–1.00)
GFR, Estimated: >60 mL/min (ref >60)
Glucose, Bld: 157 mg/dL — ABNORMAL HIGH (ref 70–99)
Potassium: 3.5 mmol/L (ref 3.5–5.1)
Sodium: 138 mmol/L (ref 135–145)

## 2024-01-30 MED ORDER — ROSUVASTATIN CALCIUM 10 MG PO TABS: 10.0000 mg | ORAL_TABLET | Freq: Every day | ORAL | 0 refills | Status: AC

## 2024-01-30 MED ORDER — FAMOTIDINE 20 MG PO TABS: 20.0000 mg | ORAL_TABLET | Freq: Every day | ORAL | 0 refills | Status: AC

## 2024-01-30 MED ORDER — TRIAMTERENE-HCTZ 37.5-25 MG PO CAPS: 1.0000 | ORAL_CAPSULE | Freq: Every day | ORAL | 0 refills | Status: AC

## 2024-01-30 MED ORDER — VITAMIN D (ERGOCALCIFEROL) 1.25 MG (50000 UNIT) PO CAPS: 50000.0000 [IU] | ORAL_CAPSULE | ORAL | 0 refills | Status: AC

## 2024-01-30 NOTE — Discharge Instructions (Signed)
 Your laboratories also are within normal limits today.  Your DVT study was negative.  I have also represcribed all your home medication, you were given a referral for outpatient follow-up in order to obtain further management of your chronic medical conditions.

## 2024-01-30 NOTE — Progress Notes (Signed)
 Venous duplex lower extremity  has been completed. Refer to [facility] under chart review to view preliminary results.   01/30/2024  2:47 PM Keydi Giel, Ricka BIRCH

## 2024-01-30 NOTE — ED Provider Notes (Signed)
  EMERGENCY DEPARTMENT AT [facility] Provider Note   CSN: [id number] Arrival date & time: 01/30/24  9780     Patient presents with: Leg Swelling   Destiny Long is a 63 y.o. female.   63 y.o female with a PMH HTN, HLD presents to the ED with a chief bilateral lower extremity swelling for the past 3 days.  Patient reports she is noted both of her legs to be really swollen.  She has not been taking her blood pressure medication, cholesterol medication approximately once a month as she relocated here.  She also reports going on a car ride with her family of approximately 9 hours to [place] .  She reports feeling the left leg especially more sore, worsening pain whenever she ambulates.  She has tried ice, heat, anti-inflammatories without much improvement in symptoms.  She denies any shortness of breath, chest pain, trauma. Prior history of tobacco use but quit approximately 4 months ago.  The history is provided by the patient.       Prior to Admission medications   Medication Sig Start Date End Date Taking? Authorizing Provider  celecoxib (CELEBREX) 200 MG capsule Take 200 mg by mouth daily.   Yes [provider]  cyclobenzaprine (FLEXERIL) 5 MG tablet Take 5 mg by mouth 3 (three) times daily as needed for muscle spasms.   Yes [provider]  phentermine 37.5 MG capsule Take 37.5 mg by mouth every morning.   Yes [provider]  Semaglutide (RYBELSUS) 3 MG TABS Take 1 tablet by mouth daily.   Yes [provider]  famotidine  (PEPCID ) 20 MG tablet Take 1 tablet (20 mg total) by mouth daily. 01/30/24   Donavan Kerlin, PA-C  rosuvastatin  (CRESTOR ) 10 MG tablet Take 1 tablet (10 mg total) by mouth daily. 01/30/24 02/29/24  Avier Jech, PA-C  triamterene -hydrochlorothiazide (DYAZIDE) 37.5-25 MG capsule Take 1 each (1 capsule total) by mouth daily. 01/30/24 02/29/24  Trinidee Schrag, PA-C  Vitamin D , Ergocalciferol , (DRISDOL ) 1.25 MG (50000  UNIT) CAPS capsule Take 1 capsule (50,000 Units total) by mouth every 7 (seven) days for 30 doses. 01/30/24 08/21/24  Evrett Hakim, PA-C    Allergies: Patient has no known allergies.    Review of Systems  Constitutional:  Negative for chills and fever.  HENT:  Negative for sore throat.   Respiratory:  Negative for shortness of breath.   Cardiovascular:  Positive for leg swelling. Negative for chest pain.  Gastrointestinal:  Negative for abdominal pain.  All other systems reviewed and are negative.   Updated Vital Signs BP (!) 144/87 (BP Location: Left Arm)   Pulse 77   Temp 97.8 F (36.6 C)   Resp 20   Ht 5' 6 (1.676 m)   Wt 99.8 kg   SpO2 96%   BMI 35.51 kg/m   Physical Exam Vitals and nursing note reviewed.  Constitutional:      Appearance: Normal appearance.  HENT:     Head: Normocephalic and atraumatic.     Mouth/Throat:     Mouth: Mucous membranes are moist.  Cardiovascular:     Rate and Rhythm: Normal rate.     Comments: 1+ bilateral pitting edema, left leg appears more swollen, tenderness along the calf area. Pulmonary:     Effort: Pulmonary effort is normal.     Breath sounds: No wheezing or rales.  Abdominal:     General: Abdomen is flat.  Musculoskeletal:     Cervical back: Normal range of motion  and neck supple.     Right lower leg: 1+ Edema present.     Left lower leg: 1+ Edema present.  Neurological:     Mental Status: She is alert and oriented to person, place, and time.     (all labs ordered are listed, but only abnormal results are displayed) Labs Reviewed  CBC WITH DIFFERENTIAL/PLATELET - Abnormal; Notable for the following components:      Result Value   RDW 17.1 (*)    Lymphs Abs 4.3 (*)    All other components within normal limits  BASIC METABOLIC PANEL WITH GFR - Abnormal; Notable for the following components:   Glucose, Bld 157 (*)    All other components within normal limits  BRAIN NATRIURETIC PEPTIDE     EKG: None  Radiology: VAS US  LOWER EXTREMITY VENOUS (DVT) (7a-7p) Result Date: 01/30/2024  Lower Venous DVT Study Patient Name:  EUSEBIA GRULKE  Date of Exam:   01/30/2024 Medical Rec #: [id number]          Accession #:    [id number] Date of Birth: [birth year]          Patient Gender: F Patient Age:   25 years Exam Location:  [facility] Procedure:      VAS US  LOWER EXTREMITY VENOUS (DVT) Referring Phys: MERL Mathea Frieling --------------------------------------------------------------------------------  Indications: BLE swelling with the left side being worse x 3 days.  Comparison Study: No priors. Performing Technologist: Ricka Sturdivant-Jones RDMS, RVT  Examination Guidelines: A complete evaluation includes B-mode imaging, spectral Doppler, color Doppler, and power Doppler as needed of all accessible portions of each vessel. Bilateral testing is considered an integral part of a complete examination. Limited examinations for reoccurring indications may be performed as noted. The reflux portion of the exam is performed with the patient in reverse Trendelenburg.  +-----+---------------+---------+-----------+----------+--------------+ RIGHTCompressibilityPhasicitySpontaneityPropertiesThrombus Aging +-----+---------------+---------+-----------+----------+--------------+ CFV  Full           Yes                                          +-----+---------------+---------+-----------+----------+--------------+ SFJ  Full                                                        +-----+---------------+---------+-----------+----------+--------------+   +---------+---------------+---------+-----------+----------+--------------+ LEFT     CompressibilityPhasicitySpontaneityPropertiesThrombus Aging +---------+---------------+---------+-----------+----------+--------------+ CFV      Full           Yes      Yes                                  +---------+---------------+---------+-----------+----------+--------------+ SFJ      Full                                                        +---------+---------------+---------+-----------+----------+--------------+ FV Prox  Full                                                        +---------+---------------+---------+-----------+----------+--------------+  FV Mid   Full                                                        +---------+---------------+---------+-----------+----------+--------------+ FV DistalFull                                                        +---------+---------------+---------+-----------+----------+--------------+ PFV      Full                                                        +---------+---------------+---------+-----------+----------+--------------+ POP      Full           Yes      Yes                                 +---------+---------------+---------+-----------+----------+--------------+ PTV      Full                                                        +---------+---------------+---------+-----------+----------+--------------+ PERO     Full                                                        +---------+---------------+---------+-----------+----------+--------------+     Summary: RIGHT: - No evidence of common femoral vein obstruction.   LEFT: - There is no evidence of deep vein thrombosis in the lower extremity.  - No cystic structure found in the popliteal fossa.  *See table(s) above for measurements and observations.    Preliminary      Procedures   Medications Ordered in the ED - No data to display                                  Medical Decision Making Amount and/or Complexity of Data Reviewed Labs: ordered.   This patient presents to the ED for concern of leg swelling, this involves a number of treatment options, and is a complaint that carries with it a high risk of complications and  morbidity.  The differential diagnosis includes DVT, heart failure, peripheral edema.   Co morbidities: Discussed in HPI   Brief History:  See HPI.   EMR reviewed including pt PMHx, past surgical history and past visits to ER.   See HPI for more details   Lab Tests:  I ordered and independently interpreted labs.  The pertinent results include:    I personally reviewed all laboratory work and imaging. Metabolic panel without any acute abnormality specifically kidney function within normal limits and  no significant electrolyte abnormalities. CBC without leukocytosis or significant anemia.   Imaging Studies:  NAD. I personally reviewed all imaging studies and no acute abnormality found. I agree with radiology interpretation.  Medicines ordered:  N/A  Reevaluation:  After the interventions noted above I re-evaluated patient and found that they have :stayed the same  Social Determinants of Health:  The patient's social determinants of health were a factor in the care of this patient   Problem List / ED Course:  Patient presented to the ED with a chief complaint of bilateral lower extremity swelling which has been ongoing for the past 3 days.  Has not taken any of her medication in approximately 1 month.  Did take a long ride with her family for approximately 9 hours to [place] .  No prior history of blood clots.  She does note some soreness to her left calf exacerbated with any type of ambulation. On evaluation no tachycardia, no hypoxia, lungs are slightly diminished to auscultation.  Prior underlying tobacco history.  No prior history of blood clots, left leg does appear more swollen than the right leg to me, there is 1+ pitting edema bilaterally, pulses are present and symmetric.  CBC with no leukocytosis, hemoglobin is within normal limits.  CMP with no electrolyte derangement, creatinine levels unremarkable.  BNP obtained in triage is negative, I do not suspect cardiac  component.  Will obtain Ultrasounds DVT study to r/o blood clot.  DVT study is negative, patient is requesting for me to have her medication sent home prescription.  She remains hemodynamically stable.  Return precautions discussed at length.  Dispostion:  After consideration of the diagnostic results and the patients response to treatment, I feel that the patent would benefit from close follow-up with primary care physician.    Portions of this note were generated with Scientist, clinical (histocompatibility and immunogenetics). Dictation errors may occur despite best attempts at proofreading.      Final diagnoses:  Leg swelling    ED Discharge Orders          Ordered    triamterene -hydrochlorothiazide (DYAZIDE) 37.5-25 MG capsule  Daily        01/30/24 1458    famotidine  (PEPCID ) 20 MG tablet  Daily        01/30/24 1458    rosuvastatin  (CRESTOR ) 10 MG tablet  Daily        01/30/24 1458    Vitamin D , Ergocalciferol , (DRISDOL ) 1.25 MG (50000 UNIT) CAPS capsule  Every 7 days        01/30/24 1458               Darline Faith, PA-C 01/30/24 1500    Pamella Ozell LABOR, DO 02/09/24 1646

## 2024-01-30 NOTE — ED Triage Notes (Addendum)
 C/o BLE swelling with the left side being worse x 3 days. Denies shob/cp or pain anywhere else. Pt did ride in the car for 7 hours on Sunday. Hx of same but swelling went down in a day.

## 2024-03-05 ENCOUNTER — Encounter (HOSPITAL_COMMUNITY): Payer: Self-pay

## 2024-03-05 ENCOUNTER — Ambulatory Visit (HOSPITAL_COMMUNITY)
Admission: EM | Admit: 2024-03-05 | Discharge: 2024-03-05 | Disposition: A | Discharge status: Home / Self Care | Payer: Self-pay | Attending: Emergency Medicine | Admitting: Emergency Medicine

## 2024-03-05 DIAGNOSIS — R051 Acute cough: Secondary | ICD-10-CM

## 2024-03-05 DIAGNOSIS — U071 COVID-19: Secondary | ICD-10-CM

## 2024-03-05 LAB — POC SARS CORONAVIRUS 2 AG -  ED: SARS Coronavirus 2 Ag: POSITIVE — AB

## 2024-03-05 MED ORDER — PAXLOVID (300/100) 20 X 150 MG & 10 X 100MG PO TBPK
3.0000 | ORAL_TABLET | Freq: Two times a day (BID) | ORAL | 0 refills | Status: AC
Start: 2024-03-05 — End: 2024-03-10

## 2024-03-05 NOTE — ED Provider Notes (Signed)
 [facility]    CSN: [id number] Arrival date & time: 03/05/24  1409      History   Chief Complaint Chief Complaint  Patient presents with   Headache    HPI Destiny Long is a 63 y.o. female.   Patient presents with nasal congestion, intermittent headache, cough, chills, and bodyaches that began on 9/1.  Denies chest pain, shortness of breath, known fever, nausea, vomiting, diarrhea, and abdominal pain.  Patient states that she works at [employer] and has been exposed to [employer] by multiple students.  Patient reports that she has been taking TheraFlu with some relief of her symptoms.  Patient would like COVID testing.  Past medical history includes hypertension, hyperlipidemia, and GERD.  Denies history of asthma or COPD.  The history is provided by the patient and medical records.  Headache   Past Medical History:  Diagnosis Date   GERD (gastroesophageal reflux disease)    Hypertension    Wears glasses     There are no active problems to display for this patient.   Past Surgical History:  Procedure Laterality Date   COLONOSCOPY     never   ESOPHAGOGASTRODUODENOSCOPY     never   TUBAL LIGATION      OB History   No obstetric history on file.      Home Medications    Prior to Admission medications   Medication Sig Start Date End Date Taking? Authorizing Provider  nirmatrelvir/ritonavir (PAXLOVID , 300/100,) 20 x 150 MG & 10 x 100MG  TBPK Take 3 tablets by mouth 2 (two) times daily for 5 days. Patient GFR is >60. Take nirmatrelvir (150 mg) two tablets twice daily for 5 days and ritonavir (100 mg) one tablet twice daily for 5 days. 03/05/24 03/10/24 Yes Gerica Koble A, NP  celecoxib (CELEBREX) 200 MG capsule Take 200 mg by mouth daily.    [provider]  cyclobenzaprine (FLEXERIL) 5 MG tablet Take 5 mg by mouth 3 (three) times daily as needed for muscle spasms.    [provider]  famotidine  (PEPCID ) 20 MG tablet Take 1 tablet (20  mg total) by mouth daily. 01/30/24   Soto, Johana, PA-C  phentermine 37.5 MG capsule Take 37.5 mg by mouth every morning.    [provider]  rosuvastatin  (CRESTOR ) 10 MG tablet Take 1 tablet (10 mg total) by mouth daily. 01/30/24 02/29/24  Soto, Johana, PA-C  Semaglutide (RYBELSUS) 3 MG TABS Take 1 tablet by mouth daily.    [provider]  triamterene -hydrochlorothiazide (DYAZIDE) 37.5-25 MG capsule Take 1 each (1 capsule total) by mouth daily. 01/30/24 02/29/24  Soto, Johana, PA-C  Vitamin D , Ergocalciferol , (DRISDOL ) 1.25 MG (50000 UNIT) CAPS capsule Take 1 capsule (50,000 Units total) by mouth every 7 (seven) days for 30 doses. 01/30/24 08/21/24  Soto, Johana, PA-C    Family History Family History  Problem Relation Age of Onset   Cancer Mother        breast   Heart disease Mother    Kidney disease Father        dialysis   Heart disease Father     Social History Social History   Tobacco Use   Smoking status: Every Day    Current packs/day: 0.25    Types: Cigarettes  Substance Use Topics   Alcohol use: Yes   Drug use: No     Allergies   Patient has no known allergies.   Review of Systems Review of Systems  Neurological:  Positive for headaches.   Per HPI  Physical Exam Triage Vital Signs ED Triage Vitals  Encounter Vitals Group     BP 03/05/24 1548 131/75     Girls Systolic BP Percentile --      Girls Diastolic BP Percentile --      Boys Systolic BP Percentile --      Boys Diastolic BP Percentile --      Pulse Rate 03/05/24 1547 90     Resp 03/05/24 1547 17     Temp 03/05/24 1548 97.9 F (36.6 C)     Temp Source 03/05/24 1547 Oral     SpO2 03/05/24 1547 95 %     Weight --      Height --      Head Circumference --      Peak Flow --      Pain Score 03/05/24 1546 4     Pain Loc --      Pain Education --      Exclude from Growth Chart --    No data found.  Updated Vital Signs BP 131/75   Pulse 90   Temp 97.9 F (36.6 C) (Oral)   Resp  17   SpO2 95%   Visual Acuity Right Eye Distance:   Left Eye Distance:   Bilateral Distance:    Right Eye Near:   Left Eye Near:    Bilateral Near:     Physical Exam Vitals and nursing note reviewed.  Constitutional:      General: She is awake. She is not in acute distress.    Appearance: Normal appearance. She is well-developed and well-groomed. She is not ill-appearing.  HENT:     Right Ear: Tympanic membrane, ear canal and external ear normal.     Left Ear: Tympanic membrane, ear canal and external ear normal.     Nose: Congestion and rhinorrhea present.     Mouth/Throat:     Mouth: Mucous membranes are moist.     Pharynx: Posterior oropharyngeal erythema and postnasal drip present. No oropharyngeal exudate.  Cardiovascular:     Rate and Rhythm: Normal rate and regular rhythm.  Pulmonary:     Effort: Pulmonary effort is normal.     Breath sounds: Normal breath sounds.  Skin:    General: Skin is warm and dry.  Neurological:     Mental Status: She is alert.  Psychiatric:        Behavior: Behavior is cooperative.      UC Treatments / Results  Labs (all labs ordered are listed, but only abnormal results are displayed) Labs Reviewed  POC SARS CORONAVIRUS 2 AG -  ED - Abnormal; Notable for the following components:      Result Value   SARS Coronavirus 2 Ag Positive (*)    All other components within normal limits    EKG   Radiology No results found.  Procedures Procedures (including critical care time)  Medications Ordered in UC Medications - No data to display  Initial Impression / Assessment and Plan / UC Course  I have reviewed the triage vital signs and the nursing notes.  Pertinent labs & imaging results that were available during my care of the patient were reviewed by me and considered in my medical decision making (see chart for details).     Patient is overall well-appearing.  Vitals are stable.  Congestion and rhinorrhea are present, erythema  and PND noted to posterior oropharynx.  Lungs clear bilateral auscultation.  COVID  testing positive.  Most recent GFR from 01/2024 was greater than 60.  Prescribed Paxlovid .  Discussed with patient that she has to stop taking the rosuvastatin  while taking this.  Discussed over-the-counter medications as needed for symptoms.  Discussed follow-up, return, and strict emergency department. Final Clinical Impressions(s) / UC Diagnoses   Final diagnoses:  Acute cough  COVID-19     Discharge Instructions      You tested positive for COVID today. Start taking Paxlovid  as instructed to on the package over the next 5 days. Stop taking your rosuvastatin  while you take this. You can take over-the-counter Coricidin for cough and congestion. You can take Tylenol every 6-8 hours as needed for any body aches, headache, sore throat, or fever. Make sure you are staying hydrated and getting plenty of rest. Follow-up with your primary care provider or return here as needed. If you develop difficulty breathing, chest pain, fevers unrelieved by medication, or severe weakness please seek immediate medical treatment in the emergency department.    ED Prescriptions     Medication Sig Dispense Auth. Provider   nirmatrelvir/ritonavir (PAXLOVID , 300/100,) 20 x 150 MG & 10 x 100MG  TBPK Take 3 tablets by mouth 2 (two) times daily for 5 days. Patient GFR is >60. Take nirmatrelvir (150 mg) two tablets twice daily for 5 days and ritonavir (100 mg) one tablet twice daily for 5 days. 30 tablet Johnie Flaming A, NP      PDMP not reviewed this encounter.   Johnie Flaming A, NP 03/05/24 [phone]

## 2024-03-05 NOTE — ED Triage Notes (Signed)
 Pt present with nasal congestion and headaches x two days.  Denies fever, states she has had chills yesterday. States she works at [employer] and was told there have been a lot of COVID cases.  Home interventions: theraflu

## 2024-03-05 NOTE — Discharge Instructions (Addendum)
 You tested positive for COVID today. Start taking Paxlovid  as instructed to on the package over the next 5 days. Stop taking your rosuvastatin  while you take this. You can take over-the-counter Coricidin for cough and congestion. You can take Tylenol every 6-8 hours as needed for any body aches, headache, sore throat, or fever. Make sure you are staying hydrated and getting plenty of rest. Follow-up with your primary care provider or return here as needed. If you develop difficulty breathing, chest pain, fevers unrelieved by medication, or severe weakness please seek immediate medical treatment in the emergency department.

## 2024-03-31 ENCOUNTER — Telehealth (INDEPENDENT_AMBULATORY_CARE_PROVIDER_SITE_OTHER): Payer: Self-pay | Admitting: Primary Care

## 2024-03-31 NOTE — Telephone Encounter (Signed)
 Called pt to reschedule appt. Pt did not want to reschedule at this time but will call back once ready.

## 2024-04-03 ENCOUNTER — Ambulatory Visit (INDEPENDENT_AMBULATORY_CARE_PROVIDER_SITE_OTHER): Admitting: Primary Care
